# Patient Record
Sex: Female | Born: 1985 | Race: White | Hispanic: No | Marital: Single | Smoking: Never smoker
Health system: Southern US, Community
[De-identification: ages and names within clinical notes are randomized; demographics above are authoritative.]

## PROBLEM LIST (undated history)

## (undated) ENCOUNTER — Emergency Department: Admission: EM | Payer: Medicaid Other

---
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
## 2006-03-25 ENCOUNTER — Emergency Department (HOSPITAL_COMMUNITY): Admission: EM | Admit: 2006-03-25 | Discharge: 2006-03-25 | Payer: Self-pay | Admitting: Emergency Medicine

## 2017-08-22 DIAGNOSIS — Z5321 Procedure and treatment not carried out due to patient leaving prior to being seen by health care provider: Secondary | ICD-10-CM | POA: Insufficient documentation

## 2017-08-22 DIAGNOSIS — M79672 Pain in left foot: Secondary | ICD-10-CM | POA: Diagnosis not present

## 2017-08-23 ENCOUNTER — Encounter (HOSPITAL_COMMUNITY): Payer: Self-pay | Admitting: Emergency Medicine

## 2017-08-23 ENCOUNTER — Emergency Department (HOSPITAL_COMMUNITY)
Admission: EM | Admit: 2017-08-23 | Discharge: 2017-08-23 | Disposition: A | Discharge status: Home / Self Care | Payer: Medicaid Other | Attending: Emergency Medicine | Admitting: Emergency Medicine

## 2017-08-23 ENCOUNTER — Emergency Department (HOSPITAL_COMMUNITY): Payer: Medicaid Other
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
## 2017-08-23 NOTE — ED Triage Notes (Signed)
Pt states just prior to arrival she hit foot on ground while attempting to do a cartwheel. Significant swelling noted to L ankle. Pt states pain extends in into foot. CMS intact.

## 2017-08-23 NOTE — ED Notes (Signed)
Pt decided to leave without being seen.

## 2021-04-12 ENCOUNTER — Encounter: Payer: Self-pay | Admitting: Radiology

## 2021-04-12 ENCOUNTER — Emergency Department: Payer: Medicaid Other

## 2021-04-12 ENCOUNTER — Emergency Department
Admission: EM | Admit: 2021-04-12 | Discharge: 2021-04-12 | Disposition: A | Discharge status: Home / Self Care | Payer: Medicaid Other | Attending: Emergency Medicine | Admitting: Emergency Medicine

## 2021-04-12 ENCOUNTER — Encounter: Payer: Self-pay | Admitting: Emergency Medicine

## 2021-04-12 DIAGNOSIS — W3400XA Accidental discharge from unspecified firearms or gun, initial encounter: Secondary | ICD-10-CM | POA: Insufficient documentation

## 2021-04-12 DIAGNOSIS — S301XXA Contusion of abdominal wall, initial encounter: Secondary | ICD-10-CM | POA: Insufficient documentation

## 2021-04-12 DIAGNOSIS — S79912A Unspecified injury of left hip, initial encounter: Secondary | ICD-10-CM | POA: Diagnosis present

## 2021-04-12 LAB — CBC WITH DIFFERENTIAL/PLATELET
Abs Immature Granulocytes: 0.03 10*3/uL (ref 0.00–0.07)
Basophils Absolute: 0 10*3/uL (ref 0.0–0.1)
Basophils Relative: 0 %
Eosinophils Absolute: 0.1 10*3/uL (ref 0.0–0.5)
Eosinophils Relative: 1 %
HCT: 37.1 % (ref 36.0–46.0)
Hemoglobin: 12.7 g/dL (ref 12.0–15.0)
Immature Granulocytes: 0 %
Lymphocytes Relative: 29 %
Lymphs Abs: 2.6 10*3/uL (ref 0.7–4.0)
MCH: 32.3 pg (ref 26.0–34.0)
MCHC: 34.2 g/dL (ref 30.0–36.0)
MCV: 94.4 fL (ref 80.0–100.0)
Monocytes Absolute: 0.5 10*3/uL (ref 0.1–1.0)
Monocytes Relative: 6 %
Neutro Abs: 5.9 10*3/uL (ref 1.7–7.7)
Neutrophils Relative %: 64 %
Platelets: 351 10*3/uL (ref 150–400)
RBC: 3.93 MIL/uL (ref 3.87–5.11)
RDW: 13.2 % (ref 11.5–15.5)
WBC: 9.1 10*3/uL (ref 4.0–10.5)
nRBC: 0 % (ref 0.0–0.2)

## 2021-04-12 LAB — COMPREHENSIVE METABOLIC PANEL
ALT: 23 U/L (ref 0–44)
AST: 30 U/L (ref 15–41)
Albumin: 4.8 g/dL (ref 3.5–5.0)
Alkaline Phosphatase: 33 U/L — ABNORMAL LOW (ref 38–126)
Anion gap: 6 (ref 5–15)
BUN: 13 mg/dL (ref 6–20)
CO2: 25 mmol/L (ref 22–32)
Calcium: 9 mg/dL (ref 8.9–10.3)
Chloride: 105 mmol/L (ref 98–111)
Creatinine, Ser: 0.74 mg/dL (ref 0.44–1.00)
GFR, Estimated: >60 mL/min (ref >60)
Glucose, Bld: 97 mg/dL (ref 70–99)
Potassium: 3.4 mmol/L — ABNORMAL LOW (ref 3.5–5.1)
Sodium: 136 mmol/L (ref 135–145)
Total Bilirubin: 1.1 mg/dL (ref 0.3–1.2)
Total Protein: 7.5 g/dL (ref 6.5–8.1)

## 2021-04-12 LAB — URINALYSIS, COMPLETE (UACMP) WITH MICROSCOPIC
Bilirubin Urine: NEGATIVE
Glucose, UA: NEGATIVE mg/dL
Ketones, ur: 20 mg/dL — AB
Leukocytes,Ua: NEGATIVE
Nitrite: NEGATIVE
Protein, ur: NEGATIVE mg/dL
Specific Gravity, Urine: 1.019 (ref 1.005–1.030)
pH: 5 (ref 5.0–8.0)

## 2021-04-12 LAB — POC URINE PREG, ED: Preg Test, Ur: NEGATIVE

## 2021-04-12 LAB — PREGNANCY, URINE: Preg Test, Ur: NEGATIVE

## 2021-04-12 MED ORDER — ACETAMINOPHEN 325 MG PO TABS
650.0000 mg | ORAL_TABLET | Freq: Once | ORAL | Status: AC | PRN
  Administered 2021-04-12: 650 mg via ORAL
  Filled 2021-04-12: qty 2

## 2021-04-12 MED ORDER — IOHEXOL 350 MG/ML SOLN
100.0000 mL | Freq: Once | INTRAVENOUS | Status: AC | PRN
  Administered 2021-04-12: 23:00:00 100 mL via INTRAVENOUS
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
## 2021-04-12 NOTE — ED Notes (Signed)
Pt requesting her IV taken out so she can leave. IV dc per Morrie Sheldon, RN.
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
## 2021-04-12 NOTE — ED Notes (Signed)
Spoke with jon frazier with [place] [employer] to notify pt has returned to ed.
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
## 2021-04-12 NOTE — ED Notes (Signed)
Pt does not wish to stay to be evaluated, states she has been here for hours advised her of how long she had been here and at that time it was 1 hour and 28 minutes.  Pt unable to be redirected to discuss the plan and advised her that a CT was ordered while she waited, pt continued to state she was leaving and went out to lobby to call for a ride.  Charge RN April aware and has spoken to Patent examiner with multiple counties about this patient.

## 2021-04-13 ENCOUNTER — Emergency Department
Admission: EM | Admit: 2021-04-13 | Discharge: 2021-04-13 | Disposition: A | Discharge status: Home / Self Care | Payer: Medicaid Other | Source: Home / Self Care | Attending: Emergency Medicine | Admitting: Emergency Medicine

## 2021-04-13 DIAGNOSIS — R58 Hemorrhage, not elsewhere classified: Secondary | ICD-10-CM

## 2021-04-13 DIAGNOSIS — T148XXA Other injury of unspecified body region, initial encounter: Secondary | ICD-10-CM

## 2021-04-13 MED ORDER — TETANUS-DIPHTH-ACELL PERTUSSIS 5-2.5-18.5 LF-MCG/0.5 IM SUSY: 0.5000 mL | PREFILLED_SYRINGE | Freq: Once | INTRAMUSCULAR | Status: DC

## 2021-04-13 MED ORDER — IBUPROFEN 800 MG PO TABS: 800.0000 mg | ORAL_TABLET | Freq: Once | ORAL | Status: DC
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
## 2021-04-15 ENCOUNTER — Encounter: Payer: Self-pay | Admitting: Emergency Medicine

## 2023-01-04 ENCOUNTER — Encounter (HOSPITAL_COMMUNITY): Payer: Self-pay

## 2023-01-04 ENCOUNTER — Ambulatory Visit (HOSPITAL_COMMUNITY)
Admission: EM | Admit: 2023-01-04 | Discharge: 2023-01-04 | Disposition: A | Discharge status: Home / Self Care | Payer: Medicaid Other | Attending: Physician Assistant | Admitting: Physician Assistant

## 2023-01-04 DIAGNOSIS — F17210 Nicotine dependence, cigarettes, uncomplicated: Secondary | ICD-10-CM

## 2023-01-04 DIAGNOSIS — S82842D Displaced bimalleolar fracture of left lower leg, subsequent encounter for closed fracture with routine healing: Secondary | ICD-10-CM

## 2023-01-04 MED ORDER — OXYCODONE HCL 5 MG PO TABS: 5.0000 mg | ORAL_TABLET | Freq: Two times a day (BID) | ORAL | 0 refills | Status: AC | PRN

## 2023-01-04 MED ORDER — OXYCODONE HCL 5 MG PO TABS: 5.0000 mg | ORAL_TABLET | Freq: Two times a day (BID) | ORAL | 0 refills | Status: DC | PRN

## 2023-01-04 MED ORDER — NICOTINE POLACRILEX 4 MG MT GUM: 4.0000 mg | CHEWING_GUM | OROMUCOSAL | 0 refills | Status: DC | PRN

## 2023-01-04 NOTE — Discharge Instructions (Signed)
I sent in oxycodone.  Be very careful with this medication as it can be sedating and can be addictive.  Use this as infrequently as possible.  Follow-up with orthopedics as soon as possible.  If you develop any numbness or tingling in your foot, severe pain, cold sensation you need to go to the emergency room.  Keep up the great work with trying to quit smoking.
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
## 2023-01-04 NOTE — ED Provider Notes (Signed)
[place]    CSN: [id number] Arrival date & time: 01/04/23  [place]      History   Chief Complaint Chief Complaint  Patient presents with   Ankle Pain    HPI Danielle Nichols is a 37 y.o. female.   Patient presents today for evaluation after injury that occurred 1 week ago.  Reports that she was at a friend's house near [facility] when she got on a horse.  She jumped off the horse when the spooked and took off running at which time they stepped on her left ankle.  She went to a local emergency room where she had dislocation and bimalleolar fracture.  This was set during that visit and she was encouraged to follow-up with orthopedics.  She lives in this area and was just able to come home.  She has not yet established with orthopedics but intends to do so in the next few days.  She was taking ibuprofen as well as oxycodone/acetaminophen to manage her pain.  She then developed a widespread rash and swelling to start taking both of these medications.  She is pretty confident that she is allergic to ibuprofen but is not 100% sure.  She notes that she is taking plain oxycodone in the past without problems.  She is requesting a short course of this medication until she is able to establish with orthopedics in this area.  She reports pain is adequately controlled with this medication regimen.  Denies any severe pain, numbness or tingling.  In addition, she is trying to quit smoking.  She is using Nicorette gum 4 mg as needed.  She is anxious to quit smoking before she has surgery as she knows that this can impact her healing.  She reports that the Nicorette gum is very expensive and she believes her insurance will cover some of the cost if it is prescribed.  She is requesting this be sent to the pharmacy.    History reviewed. No pertinent past medical history.  There are no problems to display for this patient.   History reviewed. No pertinent surgical history.  OB History   No  obstetric history on file.      Home Medications    Prior to Admission medications   Medication Sig Start Date End Date Taking? Authorizing Provider  nicotine polacrilex ([employer] NICOTINE) 4 MG gum Take 1 each (4 mg total) by mouth as needed for smoking cessation. 01/04/23   Ameilia Rattan, Derry Skill, PA-C  oxyCODONE (ROXICODONE) 5 MG immediate release tablet Take 1 tablet (5 mg total) by mouth 2 (two) times daily as needed for up to 3 days for severe pain. 01/04/23 01/07/23  Olivia Pavelko, Derry Skill, PA-C    Family History History reviewed. No pertinent family history.  Social History Social History   Tobacco Use   Smoking status: Never   Smokeless tobacco: Never  Substance Use Topics   Alcohol use: Yes   Drug use: No     Allergies   Ibuprofen   Review of Systems Review of Systems  Constitutional:  Positive for activity change. Negative for appetite change, fatigue and fever.  Musculoskeletal:  Positive for arthralgias, gait problem and joint swelling.  Skin:  Negative for rash (Resolved).  Neurological:  Negative for weakness and numbness.     Physical Exam Triage Vital Signs ED Triage Vitals  Enc Vitals Group     BP 01/04/23 1730 119/79     Pulse Rate 01/04/23 1730 82  Resp 01/04/23 1730 18     Temp 01/04/23 1730 97.9 F (36.6 C)     Temp Source 01/04/23 1730 Oral     SpO2 01/04/23 1730 100 %     Weight 01/04/23 1729 125 lb (56.7 kg)     Height 01/04/23 1729 '5\' 2"'$  (1.575 m)     Head Circumference --      Peak Flow --      Pain Score 01/04/23 1729 8     Pain Loc --      Pain Edu? --      Excl. in [place]? --    No data found.  Updated Vital Signs BP 119/79 (BP Location: Left Arm)   Pulse 82   Temp 97.9 F (36.6 C) (Oral)   Resp 18   Ht '5\' 2"'$  (1.575 m)   Wt 125 lb (56.7 kg)   LMP 12/28/2022 (Exact Date)   SpO2 100%   BMI 22.86 kg/m   Visual Acuity Right Eye Distance:   Left Eye Distance:   Bilateral Distance:    Right Eye Near:   Left Eye Near:    Bilateral  Near:     Physical Exam Vitals reviewed.  Constitutional:      General: She is awake. She is not in acute distress.    Appearance: Normal appearance. She is well-developed. She is not ill-appearing.     Comments: Very pleasant female appears stated age no acute distress sitting comfortably in exam room  HENT:     Head: Normocephalic and atraumatic.  Cardiovascular:     Rate and Rhythm: Normal rate and regular rhythm.     Heart sounds: Normal heart sounds, S1 normal and S2 normal. No murmur heard. Pulmonary:     Effort: Pulmonary effort is normal.     Breath sounds: Normal breath sounds. No wheezing, rhonchi or rales.     Comments: Clear to auscultation bilaterally Musculoskeletal:     Comments: Splint on left lower leg.  Feet:     Comments: Normal sensation and capillary refill left toes Psychiatric:        Behavior: Behavior is cooperative.      UC Treatments / Results  Labs (all labs ordered are listed, but only abnormal results are displayed) Labs Reviewed - No data to display  EKG   Radiology No results found.  Procedures Procedures (including critical care time)  Medications Ordered in UC Medications - No data to display  Initial Impression / Assessment and Plan / UC Course  I have reviewed the triage vital signs and the nursing notes.  Pertinent labs & imaging results that were available during my care of the patient were reviewed by me and considered in my medical decision making (see chart for details).     Patient is well-appearing, afebrile, nontoxic, nontachycardic.  She is able to provide pictures on her phone of the injury including ankle dislocation and repeat x-ray after this was reset.  Discussed that she would need to follow-up with a specialist as soon as possible for definitive operative management.  She is requesting a short course of oxycodone and so this was sent to the pharmacy.  Review of [place] controlled substance database shows no  inappropriate refills.  Discussed that we cannot provide additional refills of this medication and this will need to last until she is able to see specialist.  She was unsure if the acetaminophen or ibuprofen was causing the rash/swelling so oxycodone without acetaminophen was sent to pharmacy.  Discussed that if she has any alarm symptoms including numbness or tingling in her foot, cold sensation in her foot, severe pain she needs to go to the emergency room.  She was provided information for both [facility] and [facility] to schedule appointment soon as possible.  Prescription for nicotine gum was sent to pharmacy per her request in the hopes that insurance will help cover this.  Discussed that she is not to smoke cigarettes while using nicotine gum due to concern for nicotine poisoning.  Congratulated her on her efforts to quit smoking and recommended follow-up with her primary care.  At the end of visit patient reports that the initial pharmacy that her medication was sent to is closed (they close at 6 PM).  This was sent to a 24 hour pharmacy and other prescription was cancelled in the system.   Final Clinical Impressions(s) / UC Diagnoses   Final diagnoses:  Closed bimalleolar fracture of left ankle with routine healing, subsequent encounter  Cigarette smoker motivated to quit     Discharge Instructions      I sent in oxycodone.  Be very careful with this medication as it can be sedating and can be addictive.  Use this as infrequently as possible.  Follow-up with orthopedics as soon as possible.  If you develop any numbness or tingling in your foot, severe pain, cold sensation you need to go to the emergency room.  Keep up the great work with trying to quit smoking.     ED Prescriptions     Medication Sig Dispense Auth. Provider   oxyCODONE (ROXICODONE) 5 MG immediate release tablet  (Status: Discontinued) Take 1 tablet (5 mg total) by mouth 2 (two) times daily as needed  for up to 3 days for severe pain. 6 tablet Aarnav Steagall K, PA-C   nicotine polacrilex ([employer]S NICOTINE) 4 MG gum  (Status: Discontinued) Take 1 each (4 mg total) by mouth as needed for smoking cessation. 100 tablet Nautica Hotz K, PA-C   oxyCODONE (ROXICODONE) 5 MG immediate release tablet Take 1 tablet (5 mg total) by mouth 2 (two) times daily as needed for up to 3 days for severe pain. 6 tablet Emilio Baylock K, PA-C   nicotine polacrilex ([employer]S NICOTINE) 4 MG gum Take 1 each (4 mg total) by mouth as needed for smoking cessation. 100 tablet Alieyah Spader K, PA-C      I have reviewed the PDMP during this encounter.   Terrilee Croak, PA-C 01/04/23 1825

## 2023-01-26 HISTORY — PX: ANKLE SURGERY: SHX546

## 2023-08-30 ENCOUNTER — Emergency Department (HOSPITAL_COMMUNITY): Payer: Self-pay

## 2023-08-30 ENCOUNTER — Encounter (HOSPITAL_COMMUNITY): Payer: Self-pay

## 2023-08-30 ENCOUNTER — Other Ambulatory Visit: Payer: Self-pay

## 2023-08-30 ENCOUNTER — Emergency Department (HOSPITAL_COMMUNITY)
Admission: EM | Admit: 2023-08-30 | Discharge: 2023-08-30 | Disposition: A | Discharge status: Home / Self Care | Payer: Self-pay | Attending: Emergency Medicine | Admitting: Emergency Medicine

## 2023-08-30 DIAGNOSIS — T84625A Infection and inflammatory reaction due to internal fixation device of left fibula, initial encounter: Secondary | ICD-10-CM | POA: Insufficient documentation

## 2023-08-30 DIAGNOSIS — Y828 Other medical devices associated with adverse incidents: Secondary | ICD-10-CM | POA: Insufficient documentation

## 2023-08-30 DIAGNOSIS — T847XXA Infection and inflammatory reaction due to other internal orthopedic prosthetic devices, implants and grafts, initial encounter: Secondary | ICD-10-CM

## 2023-08-30 LAB — CBC WITH DIFFERENTIAL/PLATELET
Abs Immature Granulocytes: 0.03 10*3/uL (ref 0.00–0.07)
Basophils Absolute: 0.1 10*3/uL (ref 0.0–0.1)
Basophils Relative: 1 %
Eosinophils Absolute: 0.1 10*3/uL (ref 0.0–0.5)
Eosinophils Relative: 1 %
HCT: 32.8 % — ABNORMAL LOW (ref 36.0–46.0)
Hemoglobin: 10.5 g/dL — ABNORMAL LOW (ref 12.0–15.0)
Immature Granulocytes: 0 %
Lymphocytes Relative: 26 %
Lymphs Abs: 2.4 10*3/uL (ref 0.7–4.0)
MCH: 26.9 pg (ref 26.0–34.0)
MCHC: 32 g/dL (ref 30.0–36.0)
MCV: 83.9 fL (ref 80.0–100.0)
Monocytes Absolute: 0.5 10*3/uL (ref 0.1–1.0)
Monocytes Relative: 5 %
Neutro Abs: 6 10*3/uL (ref 1.7–7.7)
Neutrophils Relative %: 67 %
Platelets: 468 10*3/uL — ABNORMAL HIGH (ref 150–400)
RBC: 3.91 MIL/uL (ref 3.87–5.11)
RDW: 15.5 % (ref 11.5–15.5)
WBC: 9.1 10*3/uL (ref 4.0–10.5)
nRBC: 0 % (ref 0.0–0.2)

## 2023-08-30 LAB — BASIC METABOLIC PANEL
Anion gap: 8 (ref 5–15)
BUN: 10 mg/dL (ref 6–20)
CO2: 26 mmol/L (ref 22–32)
Calcium: 8.9 mg/dL (ref 8.9–10.3)
Chloride: 101 mmol/L (ref 98–111)
Creatinine, Ser: 0.7 mg/dL (ref 0.44–1.00)
GFR, Estimated: >60 mL/min (ref >60)
Glucose, Bld: 118 mg/dL — ABNORMAL HIGH (ref 70–99)
Potassium: 3.4 mmol/L — ABNORMAL LOW (ref 3.5–5.1)
Sodium: 135 mmol/L (ref 135–145)

## 2023-08-30 MED ORDER — NICOTINE POLACRILEX 4 MG MT GUM: 4.0000 mg | CHEWING_GUM | OROMUCOSAL | 0 refills | Status: DC | PRN

## 2023-08-30 MED ORDER — DOXYCYCLINE HYCLATE 100 MG PO CAPS: 100.0000 mg | ORAL_CAPSULE | Freq: Two times a day (BID) | ORAL | 0 refills | Status: DC

## 2023-08-30 NOTE — ED Triage Notes (Signed)
Pt had left ankle surgery in April. Pt c.o swelling and drainage to surgical site. Pt has been treated previously for surgical site infection.

## 2023-08-30 NOTE — ED Provider Notes (Signed)
[place] EMERGENCY DEPARTMENT AT [facility] Provider Note  CSN: [id number] Arrival date & time: 08/30/23 1137  Chief Complaint(s) Post-op Problem  HPI Danielle Nichols is a 37 y.o. female presenting to the emergency department with wound.  Patient reports that she had a ankle surgery last April.  Essentially continuously since her surgery she has had a wound over her lateral left ankle.  She reports that she has been on numerous courses of doxycycline which improved the symptoms however as soon as she stops this they come back.  She reports that she has never followed up with the surgeon who did her surgery.  Denies any ongoing fevers, chills, nausea, vomiting.  She has been ambulatory.   Past Medical History History reviewed. No pertinent past medical history. There are no problems to display for this patient.  Home Medication(s) Prior to Admission medications   Medication Sig Start Date End Date Taking? Authorizing Provider  doxycycline (VIBRAMYCIN) 100 MG capsule Take 1 capsule (100 mg total) by mouth 2 (two) times daily. 08/30/23  Yes Lonell Grandchild, MD  nicotine polacrilex ([employer] NICOTINE) 4 MG gum Take 1 each (4 mg total) by mouth as needed for smoking cessation. 08/30/23   Lonell Grandchild, MD                                                                                                                                    Past Surgical History Past Surgical History:  Procedure Laterality Date   ANKLE SURGERY Left 01/2023   CESAREAN SECTION     2   Family History History reviewed. No pertinent family history.  Social History Social History   Tobacco Use   Smoking status: Never   Smokeless tobacco: Never  Substance Use Topics   Alcohol use: Yes   Drug use: No   Allergies Ibuprofen  Review of Systems Review of Systems  All other systems reviewed and are negative.   Physical Exam Vital Signs  I have reviewed the triage vital signs BP 116/79    Pulse 93   Temp 98 F (36.7 C) (Oral)   Resp 20   Ht 5\' 3"  (1.6 m)   Wt 56.7 kg   SpO2 100%   BMI 22.14 kg/m  Physical Exam Vitals and nursing note reviewed.  Constitutional:      Appearance: Normal appearance.  HENT:     Head: Normocephalic and atraumatic.     Mouth/Throat:     Mouth: Mucous membranes are moist.  Eyes:     Conjunctiva/sclera: Conjunctivae normal.  Cardiovascular:     Rate and Rhythm: Normal rate.  Pulmonary:     Effort: Pulmonary effort is normal. No respiratory distress.  Abdominal:     General: Abdomen is flat.  Musculoskeletal:        General: No deformity.     Comments: Over the left lateral malleolus there is a large area of  soft tissue swelling, chronic appearing wound, over the incisional site, at the superior aspect there is a small area of erythema and tenderness.  2+ DP pulses.  Range of motion of the ankle intact without significant discomfort  Skin:    General: Skin is warm and dry.     Capillary Refill: Capillary refill takes less than 2 seconds.  Neurological:     General: No focal deficit present.     Mental Status: She is alert. Mental status is at baseline.  Psychiatric:        Mood and Affect: Mood normal.        Behavior: Behavior normal.     ED Results and Treatments Labs (all labs ordered are listed, but only abnormal results are displayed) Labs Reviewed  CBC WITH DIFFERENTIAL/PLATELET - Abnormal; Notable for the following components:      Result Value   Hemoglobin 10.5 (*)    HCT 32.8 (*)    Platelets 468 (*)    All other components within normal limits  BASIC METABOLIC PANEL - Abnormal; Notable for the following components:   Potassium 3.4 (*)    Glucose, Bld 118 (*)    All other components within normal limits                                                                                                                          Radiology DG Ankle Complete Left  Result Date: 08/30/2023 CLINICAL DATA:  Postop  infection. EXAM: LEFT ANKLE COMPLETE - 3+ VIEW COMPARISON:  Intraoperative left ankle radiographs 01/19/2023. Left foot radiographs 08/23/2017. FINDINGS: Postsurgical changes from previous distal fibular lateral plate and screw fixation and placement of 2 cannulated screws within the medial aspect of the distal tibia. Incomplete healing of the previously demonstrated fractures with peripheral bridging bone. There is irregularity of the distal tibia anteriorly on the lateral view. No hardware displacement, acute fracture, dislocation or bone destruction identified. Probable soft tissue swelling anteriorly and laterally without soft tissue emphysema or unexpected foreign body. IMPRESSION: 1. Postsurgical changes from previous distal fibular and tibial ORIF with incomplete healing of the previously demonstrated fractures. No acute osseous findings or hardware displacement. 2. Probable soft tissue swelling anteriorly and laterally without soft tissue emphysema or unexpected foreign body. 3. No radiographic evidence of osteomyelitis. Electronically Signed   By: Carey Bullocks M.D.   On: 08/30/2023 13:10    Pertinent labs & imaging results that were available during my care of the patient were reviewed by me and considered in my medical decision making (see MDM for details).  Medications Ordered in ED Medications - No data to display  Procedures Procedures  (including critical care time)  Medical Decision Making / ED Course   MDM:  37 year old presenting to the emergency department with chronic wound.  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  She reports that she has seen multiple physicians and taken multiple courses of doxycycline and her symptoms improved but always return.  X-ray shows soft tissue swelling.  Lower concern for septic joint as she is  able to range her ankle without significant discomfort.  She is not septic appearing, has no leukocytosis.  Discussed with Dr. Jena Gauss who recommends that she follow-up with her original surgeon to have the hardware removed, but if she does not want to follow-up with them, she can also follow-up with Dr. Jena Gauss.  He recommends antibiotic treatment.  Will prescribe doxycycline.  Discussed that patient should return for any worsening symptoms. Will discharge patient to home. All questions answered. Patient comfortable with plan of discharge. Return precautions discussed with patient and specified on the after visit summary.      Additional history obtained: -Additional history obtained from family -External records from outside source obtained and reviewed including: Chart review including previous notes, labs, imaging, consultation notes including prior ER visits    Lab Tests: -I ordered, reviewed, and interpreted labs.   The pertinent results include:   Labs Reviewed  CBC WITH DIFFERENTIAL/PLATELET - Abnormal; Notable for the following components:      Result Value   Hemoglobin 10.5 (*)    HCT 32.8 (*)    Platelets 468 (*)    All other components within normal limits  BASIC METABOLIC PANEL - Abnormal; Notable for the following components:   Potassium 3.4 (*)    Glucose, Bld 118 (*)    All other components within normal limits    Notable for no leukocytosis    Imaging Studies ordered: I ordered imaging studies including XR ankle  On my interpretation imaging demonstrates soft tissue swelling  I independently visualized and interpreted imaging. I agree with the radiologist interpretation   Medicines ordered and prescription drug management: Meds ordered this encounter  Medications   nicotine polacrilex ([employer]S NICOTINE) 4 MG gum    Sig: Take 1 each (4 mg total) by mouth as needed for smoking cessation.    Dispense:  100 tablet    Refill:  0   doxycycline (VIBRAMYCIN) 100 MG  capsule    Sig: Take 1 capsule (100 mg total) by mouth 2 (two) times daily.    Dispense:  20 capsule    Refill:  0    -I have reviewed the patients home medicines and have made adjustments as needed   Consultations Obtained: I requested consultation with the orthopedist,  and discussed lab and imaging findings as well as pertinent plan - they recommend: outpatient follow up    Co morbidities that complicate the patient evaluation History reviewed. No pertinent past medical history.    Dispostion: Disposition decision including need for hospitalization was considered, and patient discharged from emergency department.    Final Clinical Impression(s) / ED Diagnoses Final diagnoses:  Hardware complicating wound infection, initial encounter [facility])     This chart was dictated using voice recognition software.  Despite best efforts to proofread,  errors can occur which can change the documentation meaning.    Lonell Grandchild, MD 08/30/23 [phone]

## 2023-08-30 NOTE — Discharge Instructions (Addendum)
We evaluated you for your wound.  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  This infection will not get better until the metal plate is removed.  We have prescribed you an antibiotic to take for now.  You need to follow-up with an orthopedic surgeon to have this hardware removed.  I would recommend that you follow-up with your orthopedic surgeon who performed your surgery.  If you do not want to follow-up with this physician, you can follow-up with Dr. Jena Gauss in [place].  If you develop any new symptoms like persistent fevers, vomiting, lightheadedness or dizziness, fainting, increased swelling or pain, redness, or any other concerning symptoms, please return to the emergency department for recheck.

## 2023-12-02 ENCOUNTER — Ambulatory Visit (INDEPENDENT_AMBULATORY_CARE_PROVIDER_SITE_OTHER): Payer: MEDICAID

## 2023-12-02 ENCOUNTER — Encounter (HOSPITAL_COMMUNITY): Payer: Self-pay

## 2023-12-02 ENCOUNTER — Ambulatory Visit (HOSPITAL_COMMUNITY)
Admission: EM | Admit: 2023-12-02 | Discharge: 2023-12-02 | Disposition: A | Discharge status: Home / Self Care | Payer: MEDICAID | Attending: Family Medicine | Admitting: Family Medicine

## 2023-12-02 DIAGNOSIS — M25572 Pain in left ankle and joints of left foot: Secondary | ICD-10-CM

## 2023-12-02 DIAGNOSIS — G8929 Other chronic pain: Secondary | ICD-10-CM

## 2023-12-02 MED ORDER — OXYCODONE-ACETAMINOPHEN 5-325 MG PO TABS: 0.5000 | ORAL_TABLET | Freq: Four times a day (QID) | ORAL | 0 refills | Status: DC | PRN

## 2023-12-02 MED ORDER — DOXYCYCLINE HYCLATE 100 MG PO CAPS: 100.0000 mg | ORAL_CAPSULE | Freq: Two times a day (BID) | ORAL | 0 refills | Status: DC

## 2023-12-02 NOTE — ED Provider Notes (Signed)
 [facility]   [id number] 12/02/23 Arrival Time: 1043  ASSESSMENT & PLAN:  1. Chronic pain of left ankle    I have personally viewed and independently interpreted the imaging studies ordered this visit. Left ankle: hardware present; non-fusion of bones; no frank evidence of osteomyelitis appreciated.  Stop amox. Meds ordered this encounter  Medications   doxycycline  (VIBRAMYCIN ) 100 MG capsule    Sig: Take 1 capsule (100 mg total) by mouth 2 (two) times daily.    Dispense:  42 capsule    Refill:  0   oxyCODONE -acetaminophen  (PERCOCET/ROXICET) 5-325 MG tablet    Sig: Take 0.5-1 tablets by mouth every 6 (six) hours as needed for severe pain (pain score 7-10).    Dispense:  15 tablet    Refill:  0    Orders Placed This Encounter  Procedures   DG Ankle Complete Left   Has surgery schedule in [place] on 12/10/23.  [place] Controlled Substances Registry consulted for this patient. I feel the risk/benefit ratio today is favorable for proceeding with this prescription for a controlled substance. Medication sedation precautions given.  Reviewed expectations re: course of current medical issues. Questions answered. Outlined signs and symptoms indicating need for more acute intervention. Patient verbalized understanding. After Visit Summary given.  SUBJECTIVE: History from: patient. Danielle Nichols is a 38 y.o. female who reports chronic L ankle pain; surgery 01/2023 secondary to significant ankle fracture with hardware placed. Feels wound hasn't healed fully since surgery. Reports what she thinks is pus draining from wound occasionally. Recent Rx for amox/Tramadol given at outside facility. Tramadol not helping with pain. No extremity sensation changes or weakness. Is ambulatory. Has seen orthopaedic surgeon on [facility] who wants to do surgery on 12/10/23.  Past Surgical History:  Procedure Laterality Date   ANKLE SURGERY Left 01/2023   CESAREAN SECTION     2       OBJECTIVE:  Vitals:   12/02/23 1238  BP: 116/76  Pulse: 90  Resp: 16  Temp: 98.7 F (37.1 C)  TempSrc: Oral  SpO2: 99%    General appearance: alert; no distress HEENT: [place]; AT Neck: supple with FROM Resp: unlabored respirations Extremities: LLE: warm with well perfused appearance; swollen with lateral vertical scar; without obvious bleeding/drainage; overlying skin without erythema/warmth; area of lateral ankle is TTP; decreased ROM; normal distal sensation and capillary refill Skin: warm and dry; no visible rashes Neurologic: gait normal Psychological: alert and cooperative; normal mood and affect  Imaging: DG Ankle Complete Left Result Date: 12/02/2023 CLINICAL DATA:  Left ankle pain and swelling. EXAM: LEFT ANKLE COMPLETE - 3+ VIEW COMPARISON:  Left ankle radiographs 08/30/2023, intraoperative fluoroscopy 01/19/2023, left ankle radiographs 08/23/2017 FINDINGS: The ankle mortise is symmetric and intact. Redemonstration of lateral plate and screw fixation of the distal fibula. There is again likely fusion of the far lateral cortex with persistent fracture line lucency of the majority of the transverse fracture. There is a transverse screw and a more vertically oriented oblique screw that traverse a vertically oriented sagittal plane fracture of the superomedial aspect of the medial malleolus. There is progressive moderate bridging callus formation at the superior aspect of the distal medial tibial fracture, however the majority of the craniocaudal portion of the fracture at the junction of the distal tibial metaphysis and medial malleolus remains nonfused. Minimal decrease in mild-to-moderate diffuse soft tissue swelling. No cortical erosion is visualized. IMPRESSION: 1. Redemonstration of ORIF of the medial and lateral malleoli. 2. There is again likely  fusion of the far lateral cortex of the distal fibular fracture with persistent fracture line lucency of the majority of the transverse  fracture. 3. Progressive moderate bridging callus formation at the superior aspect of the distal medial tibial fracture, however the majority of the craniocaudal portion of the fracture at the junction of the distal tibial metaphysis and medial malleolus remains nonfused. Electronically Signed   By: Tanda Lyons M.D.   On: 12/02/2023 13:53      Allergies  Allergen Reactions   Ibuprofen      History reviewed. No pertinent past medical history. Social History   Socioeconomic History   Marital status: Single    Spouse name: Not on file   Number of children: Not on file   Years of education: Not on file   Highest education level: Not on file  Occupational History   Not on file  Tobacco Use   Smoking status: Never   Smokeless tobacco: Never  Substance and Sexual Activity   Alcohol use: Yes   Drug use: No   Sexual activity: Not on file  Other Topics Concern   Not on file  Social History Narrative   ** Merged History Encounter **       Social Drivers of Health   Financial Resource Strain: Not on file  Food Insecurity: Not on file  Transportation Needs: Not on file  Physical Activity: Not on file  Stress: Not on file  Social Connections: Not on file   History reviewed. No pertinent family history. Past Surgical History:  Procedure Laterality Date   ANKLE SURGERY Left 01/2023   CESAREAN SECTION     2       Rolinda Rogue, MD 12/02/23 [phone]

## 2023-12-02 NOTE — Discharge Instructions (Signed)
 Be aware, you have been prescribed pain medications that may cause drowsiness. While taking this medication, do not take any other medications containing acetaminophen (Tylenol). Do not combine with alcohol or recreational drugs. Please do not drive, operate heavy machinery, or take part in activities that require making important decisions while on this medication as your judgement may be clouded.

## 2023-12-02 NOTE — ED Triage Notes (Signed)
 Pt is here for left t ankle pain and swelling. Patient states she has infection in her bones and in so much pain.

## 2024-03-12 ENCOUNTER — Encounter (HOSPITAL_COMMUNITY): Payer: Self-pay

## 2024-03-12 ENCOUNTER — Ambulatory Visit (HOSPITAL_COMMUNITY)
Admission: EM | Admit: 2024-03-12 | Discharge: 2024-03-12 | Disposition: A | Discharge status: Home / Self Care | Payer: MEDICAID | Attending: Family Medicine | Admitting: Family Medicine

## 2024-03-12 DIAGNOSIS — M25572 Pain in left ankle and joints of left foot: Secondary | ICD-10-CM

## 2024-03-12 DIAGNOSIS — T8149XA Infection following a procedure, other surgical site, initial encounter: Secondary | ICD-10-CM | POA: Diagnosis not present

## 2024-03-12 MED ORDER — DOXYCYCLINE HYCLATE 100 MG PO CAPS: 100.0000 mg | ORAL_CAPSULE | Freq: Two times a day (BID) | ORAL | 0 refills | Status: AC

## 2024-03-12 MED ORDER — OXYCODONE-ACETAMINOPHEN 5-325 MG PO TABS: 0.5000 | ORAL_TABLET | Freq: Four times a day (QID) | ORAL | 0 refills | Status: AC | PRN

## 2024-03-12 NOTE — ED Triage Notes (Signed)
 Pt states that she has some left ankle pain and swelling. X2-3 days  Pt states that she recently had surgery on this ankle.  Pt states that she also has some fatigue.

## 2024-03-12 NOTE — Discharge Instructions (Signed)
 Be aware, you have been prescribed pain medications that may cause drowsiness. While taking this medication, do not take any other medications containing acetaminophen (Tylenol). Do not combine with alcohol or recreational drugs. Please do not drive, operate heavy machinery, or take part in activities that require making important decisions while on this medication as your judgement may be clouded.

## 2024-03-14 NOTE — ED Provider Notes (Addendum)
 [facility]   [id number] 03/12/24 Arrival Time: 1646  ASSESSMENT & PLAN:  1. Acute left ankle pain   2. Infected surgical wound   Chronic problem with acute exacerbation.  Meds ordered this encounter  Medications   oxyCODONE -acetaminophen  (PERCOCET/ROXICET) 5-325 MG tablet    Sig: Take 0.5-1 tablets by mouth every 6 (six) hours as needed for severe pain (pain score 7-10).    Dispense:  15 tablet    Refill:  0   doxycycline  (VIBRAMYCIN ) 100 MG capsule    Sig: Take 1 capsule (100 mg total) by mouth 2 (two) times daily.    Dispense:  20 capsule    Refill:  0   [place] Controlled Substances Registry consulted for this patient. I feel the risk/benefit ratio today is favorable for proceeding with this prescription for a controlled substance. Medication sedation precautions given.   Follow-up Information     Go to  [facility]   Specialty: Specialist Contact information: [address] [place] [address] [place] [address] [phone]         [facility] at [facility].   Specialty: Emergency Medicine Why: If symptoms worsen in any way. Contact information: [address] [place]  [phone]               Agrees to ED evaluation should she worsen in any way.  Reviewed expectations re: course of current medical issues. Questions answered. Outlined signs and symptoms indicating need for more acute intervention. Patient verbalized understanding. After Visit Summary given.  SUBJECTIVE: History from: patient. Danielle Nichols is a 38 y.o. female who reports that she has some left ankle pain and swelling; PMH of L ankle surgery with hardware currently in place; "still need to have it removed"; reports swelling and oozing of "yellowish stuff" for the past 2-3 days. Denies fever. Ambulatory here.  Past Surgical History:  Procedure Laterality Date   ANKLE SURGERY Left 01/2023   CESAREAN SECTION     2       OBJECTIVE:  Vitals:   03/12/24 1741 03/12/24 1744  BP:  103/71  Pulse:  67  Resp:  17  Temp:  97.8 F (36.6 C)  TempSrc:  Oral  SpO2:  95%  Weight: 63.5 kg   Height: 5\' 2"  (1.575 m)     General appearance: alert; no distress HEENT: [place]; AT Neck: supple with FROM Resp: unlabored respirations Extremities: LLE: warm with well perfused appearance; lateral swelling of L ankle with healed surgical scar; at inferior end of surgical scar there is the slightest small area of fluctuance and slight yellow/clear drainage seeping from area; mild TTP CV: brisk extremity capillary refill of LUE; 2+ radial pulse of LUE. Skin: warm and dry; no visible rashes Neurologic: gait normal; normal sensation and strength of LUE Psychological: alert and cooperative; normal mood and affect     Allergies  Allergen Reactions   Ibuprofen      History reviewed. No pertinent past medical history. Social History   Socioeconomic History   Marital status: Single    Spouse name: Not on file   Number of children: Not on file   Years of education: Not on file   Highest education level: Not on file  Occupational History   Not on file  Tobacco Use   Smoking status: Never   Smokeless tobacco: Never  Substance and Sexual Activity   Alcohol use: Yes   Drug use: No   Sexual activity: Not on file  Other Topics Concern   Not on file  Social History Narrative   ** Merged History Encounter **       Social Drivers of Corporate investment banker Strain: Not on file  Food Insecurity: Not on file  Transportation Needs: Not on file  Physical Activity: Not on file  Stress: Not on file  Social Connections: Not on file   History reviewed. No pertinent family history. Past Surgical History:  Procedure Laterality Date   ANKLE SURGERY Left 01/2023   CESAREAN SECTION     2       Afton Albright, MD 03/14/24 1610    Afton Albright, MD 03/14/24 [phone]

## 2024-10-31 ENCOUNTER — Ambulatory Visit: Payer: Self-pay | Admitting: Podiatry

## 2024-11-08 ENCOUNTER — Ambulatory Visit: Payer: MEDICAID | Admitting: Family
